# Patient Record
Sex: Female | Born: 2005 | Hispanic: No | Marital: Single | Smoking: Never smoker
Health system: Southern US, Community
[De-identification: ages and names within clinical notes are randomized; demographics above are authoritative.]

## PROBLEM LIST (undated history)

## (undated) DIAGNOSIS — J45909 Unspecified asthma, uncomplicated: Secondary | ICD-10-CM

## (undated) DIAGNOSIS — I10 Essential (primary) hypertension: Secondary | ICD-10-CM

## (undated) DIAGNOSIS — K30 Functional dyspepsia: Secondary | ICD-10-CM

## (undated) HISTORY — DX: Unspecified asthma, uncomplicated: J45.909

## (undated) HISTORY — PX: NO PAST SURGERIES: SHX2092

## (undated) HISTORY — PX: WISDOM TOOTH EXTRACTION: SHX21

---
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
## 2023-02-16 ENCOUNTER — Encounter (HOSPITAL_COMMUNITY): Payer: Self-pay

## 2023-02-16 ENCOUNTER — Ambulatory Visit (HOSPITAL_COMMUNITY)
Admission: EM | Admit: 2023-02-16 | Discharge: 2023-02-16 | Disposition: A | Discharge status: Home / Self Care | Payer: Medicaid Other | Source: Home / Self Care

## 2023-02-16 DIAGNOSIS — J029 Acute pharyngitis, unspecified: Secondary | ICD-10-CM | POA: Diagnosis not present

## 2023-02-16 DIAGNOSIS — Z23 Encounter for immunization: Secondary | ICD-10-CM | POA: Diagnosis not present

## 2023-02-16 DIAGNOSIS — S99929A Unspecified injury of unspecified foot, initial encounter: Secondary | ICD-10-CM | POA: Diagnosis not present

## 2023-02-16 LAB — POCT RAPID STREP A (OFFICE): Rapid Strep A Screen: NEGATIVE

## 2023-02-16 MED ORDER — TETANUS-DIPHTH-ACELL PERTUSSIS 5-2.5-18.5 LF-MCG/0.5 IM SUSY
PREFILLED_SYRINGE | INTRAMUSCULAR | Status: AC
  Filled 2023-02-16: qty 0.5

## 2023-02-16 MED ORDER — TETANUS-DIPHTH-ACELL PERTUSSIS 5-2.5-18.5 LF-MCG/0.5 IM SUSY
0.5000 mL | PREFILLED_SYRINGE | Freq: Once | INTRAMUSCULAR | Status: AC
  Administered 2023-02-16: 0.5 mL via INTRAMUSCULAR

## 2023-02-16 NOTE — Discharge Instructions (Signed)
Your strep was negative today in clinic, we are sending it off for culture and we will contact you if antibiotics are indicated. In the meantime, please use warm saline gargles, drink tea with honey, sleep with a humidifier and you can alternate tylenol and ibuprofen every 4-6 hours as needed for pain and fever.   We updated your Tdap today. Please keep your wound clean and return to clinic for any signs of infection.   Return to clinic for new or concerning symptoms or if no improvement over the next 7-10 days.

## 2023-02-16 NOTE — ED Provider Notes (Signed)
[facility]    CSN: [id number] Arrival date & time: 02/16/23  1119      History   Chief Complaint Chief Complaint  Patient presents with   Sore Throat    HPI Annette Garza is a 17 y.o. female.   Patient presents to clinic for complaints of sore throat that started yesterday.  She did feel warm last night, did not take her temperature.  She did take ibuprofen this morning, this has helped her symptoms.  Denies any nasal congestion, cough or recent sick contacts.  Declined COVID-19 testing in clinic.  Reports history of strep throat.  She also caught herself on the lawnmower a few days prior, has a wound to the sole of her left foot.  She has been keeping her wound clean and dry, but is unsure when her last Tdap was updated.     The history is provided by the patient and medical records.  Sore Throat Pertinent negatives include no chest pain.    Past Medical History:  Diagnosis Date   Hypertension    Indigestion     Patient Active Problem List   Diagnosis Date Noted   Elevated TSH 04/02/2019   Abnormal thyroid blood test 04/02/2019    Past Surgical History:  Procedure Laterality Date   TOOTH EXTRACTION N/A 01/13/2022   Procedure: DENTAL RESTORATION/EXTRACTIONS;  Surgeon: Ocie Doyne, DMD;  Location: [facility];  Service: Oral Surgery;  Laterality: N/A;   WISDOM TOOTH EXTRACTION      OB History   No obstetric history on file.      Home Medications    Prior to Admission medications   Medication Sig Start Date End Date Taking? Authorizing Provider  amLODipine (NORVASC) 5 MG tablet Take 5 mg by mouth daily. 10/22/21   [provider]  fluticasone (FLONASE) 50 MCG/ACT nasal spray Place 1 spray into both nostrils daily as needed for allergies.    [provider]  diphenhydrAMINE (BENADRYL) 25 mg capsule Take 1 capsule (25 mg total) by mouth every 6 (six) hours as needed for itching. Patient not taking: Reported on 04/02/2019  01/30/15 04/21/19  Lowanda Foster, NP    Family History Family History  Problem Relation Age of Onset   Cancer Paternal Grandfather     Social History Social History   Tobacco Use   Smoking status: Never   Smokeless tobacco: Never  Vaping Use   Vaping status: Never Used  Substance Use Topics   Alcohol use: Never   Drug use: Never     Allergies   Patient has no known allergies.   Review of Systems Review of Systems  Constitutional:  Negative for chills.  HENT:  Positive for sore throat. Negative for congestion and trouble swallowing.   Respiratory:  Negative for cough.   Cardiovascular:  Negative for chest pain.  Skin:  Positive for wound.     Physical Exam Triage Vital Signs ED Triage Vitals  Encounter Vitals Group     BP 02/16/23 1145 121/69     Systolic BP Percentile --      Diastolic BP Percentile --      Pulse Rate 02/16/23 1145 71     Resp 02/16/23 1145 16     Temp 02/16/23 1145 98.1 F (36.7 C)     Temp Source 02/16/23 1145 Oral     SpO2 02/16/23 1145 98 %     Weight --      Height --      Head  Circumference --      Peak Flow --      Pain Score 02/16/23 1148 6     Pain Loc --      Pain Education --      Exclude from Growth Chart --    No data found.  Updated Vital Signs BP 121/69 (BP Location: Left Arm)   Pulse 71   Temp 98.1 F (36.7 C) (Oral)   Resp 16   LMP 02/10/2023   SpO2 98%   Visual Acuity Right Eye Distance:   Left Eye Distance:   Bilateral Distance:    Right Eye Near:   Left Eye Near:    Bilateral Near:     Physical Exam Vitals and nursing note reviewed.  Constitutional:      Appearance: She is well-developed.  HENT:     Head: Normocephalic and atraumatic.     Nose: No congestion or rhinorrhea.     Mouth/Throat:     Mouth: Mucous membranes are moist.     Pharynx: Uvula midline. Posterior oropharyngeal erythema present.     Tonsils: No tonsillar exudate or tonsillar abscesses. 2+ on the right. 2+ on the left.   Cardiovascular:     Rate and Rhythm: Normal rate.  Pulmonary:     Effort: Pulmonary effort is normal.  Skin:    General: Skin is warm and dry.  Neurological:     General: No focal deficit present.     Mental Status: She is alert and oriented to person, place, and time.  Psychiatric:        Mood and Affect: Mood normal.        Behavior: Behavior normal.      UC Treatments / Results  Labs (all labs ordered are listed, but only abnormal results are displayed) Labs Reviewed  CULTURE, GROUP A STREP [facility])  POCT RAPID STREP A (OFFICE)    EKG   Radiology No results found.  Procedures Procedures (including critical care time)  Medications Ordered in UC Medications  Tdap (BOOSTRIX) injection 0.5 mL (0.5 mLs Intramuscular Given 02/16/23 1221)    Initial Impression / Assessment and Plan / UC Course  I have reviewed the triage vital signs and the nursing notes.  Pertinent labs & imaging results that were available during my care of the patient were reviewed by me and considered in my medical decision making (see chart for details).  Vitals and triage reviewed, patient is hemodynamically stable.  Sore throat for the past day, posterior pharynx with erythema, no exudate.  Rapid strep is negative, will send for culture.  Patient declined COVID-19 testing.  Symptomatic management for pharyngitis discussed, patient verbalized understanding.  Plan of care, follow-up care and return precautions given, no questions at this time.    Final Clinical Impressions(s) / UC Diagnoses   Final diagnoses:  Pharyngitis, unspecified etiology  Injury of foot, unspecified laterality, initial encounter     Discharge Instructions      Your strep was negative today in clinic, we are sending it off for culture and we will contact you if antibiotics are indicated. In the meantime, please use warm saline gargles, drink tea with honey, sleep with a humidifier and you can alternate tylenol and  ibuprofen every 4-6 hours as needed for pain and fever.   We updated your Tdap today. Please keep your wound clean and return to clinic for any signs of infection.   Return to clinic for new or concerning symptoms or if no improvement over  the next 7-10 days.      ED Prescriptions   None    PDMP not reviewed this encounter.   Ramonte Mena, [place], [place] 02/16/23 [phone]

## 2023-02-16 NOTE — ED Triage Notes (Signed)
Patient reports that she began having a sore throat yesterday and today worsening.  Patient states she has taken ibuprofen 600 mg at 0700 today.
